# Patient Record
Sex: Female | Born: 1985 | Race: White | Hispanic: No | Marital: Single | Smoking: Current some day smoker
Health system: Southern US, Community
[De-identification: ages and names within clinical notes are randomized; demographics above are authoritative.]

## PROBLEM LIST (undated history)

## (undated) DIAGNOSIS — F329 Major depressive disorder, single episode, unspecified: Secondary | ICD-10-CM

## (undated) DIAGNOSIS — IMO0002 Reserved for concepts with insufficient information to code with codable children: Secondary | ICD-10-CM

## (undated) DIAGNOSIS — A630 Anogenital (venereal) warts: Secondary | ICD-10-CM

## (undated) DIAGNOSIS — E119 Type 2 diabetes mellitus without complications: Secondary | ICD-10-CM

## (undated) DIAGNOSIS — F32A Depression, unspecified: Secondary | ICD-10-CM

## (undated) DIAGNOSIS — J45909 Unspecified asthma, uncomplicated: Secondary | ICD-10-CM

## (undated) DIAGNOSIS — R569 Unspecified convulsions: Secondary | ICD-10-CM

## (undated) HISTORY — DX: Unspecified asthma, uncomplicated: J45.909

## (undated) HISTORY — DX: Type 2 diabetes mellitus without complications: E11.9

## (undated) HISTORY — DX: Anogenital (venereal) warts: A63.0

## (undated) HISTORY — DX: Major depressive disorder, single episode, unspecified: F32.9

## (undated) HISTORY — DX: Reserved for concepts with insufficient information to code with codable children: IMO0002

## (undated) HISTORY — PX: OTHER SURGICAL HISTORY: SHX169

## (undated) HISTORY — PX: UPPER GI ENDOSCOPY: SHX6162

## (undated) HISTORY — DX: Depression, unspecified: F32.A

## (undated) HISTORY — DX: Unspecified convulsions: R56.9

---
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
## 2005-11-07 ENCOUNTER — Other Ambulatory Visit: Admission: RE | Admit: 2005-11-07 | Discharge: 2005-11-07 | Payer: Self-pay | Admitting: Obstetrics & Gynecology

## 2005-12-21 ENCOUNTER — Ambulatory Visit: Payer: Self-pay | Admitting: Internal Medicine

## 2006-02-22 ENCOUNTER — Encounter (INDEPENDENT_AMBULATORY_CARE_PROVIDER_SITE_OTHER): Payer: Self-pay | Admitting: Specialist

## 2006-02-22 ENCOUNTER — Ambulatory Visit (HOSPITAL_COMMUNITY): Admission: RE | Admit: 2006-02-22 | Discharge: 2006-02-23 | Payer: Self-pay | Admitting: Otolaryngology

## 2006-06-04 ENCOUNTER — Other Ambulatory Visit: Admission: RE | Admit: 2006-06-04 | Discharge: 2006-06-04 | Payer: Self-pay | Admitting: Obstetrics & Gynecology

## 2006-08-07 HISTORY — PX: TONSILLECTOMY AND ADENOIDECTOMY: SUR1326

## 2006-08-31 ENCOUNTER — Ambulatory Visit: Payer: Self-pay | Admitting: Internal Medicine

## 2006-08-31 LAB — CONVERTED CEMR LAB
Basophils Absolute: 0.1 10*3/uL (ref 0.0–0.1)
Basophils Relative: 1.1 % — ABNORMAL HIGH (ref 0.0–1.0)
Eosinophils Absolute: 0.1 10*3/uL (ref 0.0–0.6)
Eosinophils Relative: 1.8 % (ref 0.0–5.0)
HCT: 40.7 % (ref 36.0–46.0)
Hemoglobin: 14.2 g/dL (ref 12.0–15.0)
Lymphocytes Relative: 23.1 % (ref 12.0–46.0)
MCHC: 34.8 g/dL (ref 30.0–36.0)
MCV: 90.5 fL (ref 78.0–100.0)
Monocytes Absolute: 0.6 10*3/uL (ref 0.2–0.7)
Monocytes Relative: 8.7 % (ref 3.0–11.0)
Neutro Abs: 4.9 10*3/uL (ref 1.4–7.7)
Neutrophils Relative %: 65.3 % (ref 43.0–77.0)
Platelets: 273 10*3/uL (ref 150–400)
RBC: 4.49 M/uL (ref 3.87–5.11)
RDW: 11.6 % (ref 11.5–14.6)
WBC: 7.4 10*3/uL (ref 4.5–10.5)

## 2006-09-14 ENCOUNTER — Ambulatory Visit: Payer: Self-pay | Admitting: Internal Medicine

## 2006-10-22 ENCOUNTER — Ambulatory Visit: Payer: Self-pay | Admitting: Internal Medicine

## 2006-11-16 ENCOUNTER — Other Ambulatory Visit: Admission: RE | Admit: 2006-11-16 | Discharge: 2006-11-16 | Payer: Self-pay | Admitting: Obstetrics & Gynecology

## 2008-01-02 ENCOUNTER — Other Ambulatory Visit: Admission: RE | Admit: 2008-01-02 | Discharge: 2008-01-02 | Payer: Self-pay | Admitting: Obstetrics and Gynecology

## 2009-06-28 ENCOUNTER — Encounter (INDEPENDENT_AMBULATORY_CARE_PROVIDER_SITE_OTHER): Payer: Self-pay | Admitting: *Deleted

## 2009-06-28 ENCOUNTER — Telehealth (INDEPENDENT_AMBULATORY_CARE_PROVIDER_SITE_OTHER): Payer: Self-pay | Admitting: *Deleted

## 2009-07-05 ENCOUNTER — Ambulatory Visit: Payer: Self-pay | Admitting: Internal Medicine

## 2009-07-05 DIAGNOSIS — R1084 Generalized abdominal pain: Secondary | ICD-10-CM | POA: Insufficient documentation

## 2009-07-05 DIAGNOSIS — R1013 Epigastric pain: Secondary | ICD-10-CM

## 2009-07-05 LAB — CONVERTED CEMR LAB: Tissue Transglutaminase Ab, IgA: 0.2 units (ref <7)

## 2009-07-06 LAB — CONVERTED CEMR LAB
ALT: 13 units/L (ref 0–35)
AST: 13 units/L (ref 0–37)
Albumin: 4 g/dL (ref 3.5–5.2)
Alkaline Phosphatase: 80 units/L (ref 39–117)
Amylase: 56 units/L (ref 27–131)
BUN: 6 mg/dL (ref 6–23)
Basophils Absolute: 0.1 10*3/uL (ref 0.0–0.1)
Basophils Relative: 0.9 % (ref 0.0–3.0)
Bilirubin Urine: NEGATIVE
Bilirubin, Direct: 0 mg/dL (ref 0.0–0.3)
CO2: 28 meq/L (ref 19–32)
Calcium: 8.8 mg/dL (ref 8.4–10.5)
Chloride: 102 meq/L (ref 96–112)
Creatinine, Ser: 0.6 mg/dL (ref 0.4–1.2)
Eosinophils Absolute: 0.2 10*3/uL (ref 0.0–0.7)
Eosinophils Relative: 2.6 % (ref 0.0–5.0)
GFR calc non Af Amer: 130.69 mL/min (ref >60)
Glucose, Bld: 126 mg/dL — ABNORMAL HIGH (ref 70–99)
H Pylori IgG: NEGATIVE
HCT: 41.7 % (ref 36.0–46.0)
Hemoglobin, Urine: NEGATIVE
Hemoglobin: 14.3 g/dL (ref 12.0–15.0)
IgA: 149 mg/dL (ref 68–378)
Ketones, ur: NEGATIVE mg/dL
Leukocytes, UA: NEGATIVE
Lipase: 3 units/L — ABNORMAL LOW (ref 11.0–59.0)
Lymphocytes Relative: 27.3 % (ref 12.0–46.0)
Lymphs Abs: 1.7 10*3/uL (ref 0.7–4.0)
MCHC: 34.2 g/dL (ref 30.0–36.0)
MCV: 92.7 fL (ref 78.0–100.0)
Monocytes Absolute: 0.5 10*3/uL (ref 0.1–1.0)
Monocytes Relative: 7.6 % (ref 3.0–12.0)
Neutro Abs: 3.8 10*3/uL (ref 1.4–7.7)
Neutrophils Relative %: 61.6 % (ref 43.0–77.0)
Nitrite: NEGATIVE
Platelets: 242 10*3/uL (ref 150.0–400.0)
Potassium: 4 meq/L (ref 3.5–5.1)
RBC: 4.5 M/uL (ref 3.87–5.11)
RDW: 11.9 % (ref 11.5–14.6)
Sodium: 138 meq/L (ref 135–145)
Specific Gravity, Urine: 1.025 (ref 1.000–1.030)
Total Bilirubin: 0.8 mg/dL (ref 0.3–1.2)
Total Protein, Urine: NEGATIVE mg/dL
Total Protein: 7 g/dL (ref 6.0–8.3)
Urine Glucose: NEGATIVE mg/dL
Urobilinogen, UA: 0.2 (ref 0.0–1.0)
WBC: 6.3 10*3/uL (ref 4.5–10.5)
pH: 5.5 (ref 5.0–8.0)

## 2009-07-15 ENCOUNTER — Ambulatory Visit (HOSPITAL_COMMUNITY): Admission: RE | Admit: 2009-07-15 | Discharge: 2009-07-15 | Payer: Self-pay | Admitting: Internal Medicine

## 2009-07-15 ENCOUNTER — Telehealth: Payer: Self-pay | Admitting: Internal Medicine

## 2009-07-26 ENCOUNTER — Telehealth: Payer: Self-pay | Admitting: Internal Medicine

## 2010-12-23 NOTE — Assessment & Plan Note (Signed)
[facility]                         GASTROENTEROLOGY OFFICE NOTE   Gina Armstrong, Gina Armstrong                   MRN:          [id number]  DATE:09/14/2006                            DOB:          [birth year]    REFERRING PHYSICIAN:  Titus Dubin. Alwyn Ren, MD,FACP,FCCP   REASON FOR CONSULTATION:  Rectal bleeding.   HISTORY:  This is a 25 year old white female, type 1 diabetic, who is  referred through the courtesy of Dr. Alwyn Ren regarding recurrent rectal  bleeding.  The patient was in her usual state of good health until  August 29, 2006, when after having a normal bowel movement, noticed a  large volume of blood in the toilet bowl, as well as the tissue.  There  was no associated abdominal pain, no associated rectal pain.  The  patient was seen two days later by Dr. Alwyn Ren.  No significant  abnormality on rectal exam.  Hemoccult testing that day was negative.  CBC was normal.  She was presumed to have a transient fissure and  expectant management was recommended.  However, she developed a second  episode of bleeding, which was not nearly as voluminous.  Overall, she  has been well.  Diabetes has been under good control.  She has had no  problems with abdominal pain, change in bowel habits, constipation,  appetite or weight change.  She is active in school.   PAST MEDICAL HISTORY:  Type 1 diabetes since age 87.   PAST SURGICAL HISTORY:  None.   ALLERGIES:  Possibly AUGMENTIN.   REGULAR MEDICATIONS:  1. Humalog insulin pump.  2. Depo-Provera injections every 3 months.  3. Ibuprofen, Aleve and Tylenol p.r.n.   FAMILY HISTORY:  Father with colon polyps.   SOCIAL HISTORY:  The patient is single, without children.  She is a  Holiday representative at [employer].  She occasionally smokes and  occasionally uses alcohol.   REVIEW OF SYSTEMS:  Per diagnostic evaluation form.   PHYSICAL EXAM:  Well-appearing female, in no acute distress.  Blood  pressure is  138/74, heart rate is 82, weight is 141 pounds.  She is 5  feet 10 inches in height.  HEENT:  Sclerae are anicteric.  Oral mucosa is intact.  There is no  adenopathy.  LUNGS:  Clear.  HEART:  Regular.  ABDOMEN:  Soft, without tenderness, mass or hernia.  EXTREMITIES:  Without edema.  RECTAL EXAM: Per Dr. Alwyn Ren Jan.25,2008. Not repeated today.   IMPRESSION:  This is a 25 year old with recurrent rectal bleeding.  No  obvious cause on rectal exam per Dr. Alwyn Ren.  The patient could have a  fissure, though I would expect her to have some tenderness or  discomfort.  She certainly could have internal hemorrhoids.  Alternatively, she could have a juvenile retention polyp or less likely  adenomatous neoplasia.   RECOMMENDATIONS:  At this point, due to the recurrent nature of the  bleeding and lack of obvious cause, I think it would be best to proceed  with direct evaluation.  We will set her up for colonoscopy and, if  necessary, polypectomy.  I have discussed the  nature of the procedure,  as well as the risks, benefits, and alternatives, with the patient and  her mom.  We also discussed management of her insulin.  She will contact  her endocrinologist.  I advised that, if there are any interval  questions or problems, to give me a call.     Wilhemina Bonito. Marina Goodell, MD  Electronically Signed    JNP/MedQ  DD: 09/14/2006  DT: 09/14/2006  Job #: [id number]   cc:   Titus Dubin. Alwyn Ren, MD,FACP,FCCP

## 2010-12-23 NOTE — H&P (Signed)
Gina Armstrong, Gina Armstrong            ACCOUNT NO.:  [id number]   MEDICAL RECORD NO.:  [id number]          PATIENT TYPE:  INP   LOCATION:  2899                         FACILITY:  [facility]   PHYSICIAN:  Carolan Shiver, M.D.    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  When seen on November 28, 2005 she had had  a five to six-week history of chronic illness.  She had been treated by Dr.  Samuella Cota, otolaryngologist in [place].  She had been treated with three  different antibiotics and they were causing GI upset.  She was currently on  Omnicef when seen.  She had had a white blood cell count of 19,000, was  treated with IM antibiotics.  Her white count elevated again to 13,000.  She  had had a history of Streptococcal tonsillitis dating back to age 67.  She,  importantly, has been diagnosed as having type 1 diabetes mellitus and was  currently on an insulin pump, not in good control.  She has also had an  abnormal Pap smear with some dysplastic changes and was at that time waiting  to hear her final pathology report.   Dr. Samuella Cota had recommended a tonsillectomy.  She desired to have this closer  to home so she could be with her parents for the convalescent period.  She  then was traveling to [place] for summer study and delayed the tonsillectomy  until her return from [place].   PAST MEDICAL HISTORY:  Type 1 diabetes mellitus with insulin pump.   OPERATIONS:  None reported.   MEDICATIONS:  1.  Humalog insulin via her insulin pump.  She uses Humalog insulin per day      in the high 30s to  low 40 range.  2.  She is also on Depo Provera subcutaneously once every three months.  3.  She was on Omnicef 300 mg b.i.d. at that time, was decreased to 300 mg      p.o. daily while she was in [place] as prophylaxis.   ALLERGIES TO MEDICATIONS:  None reported.   FAMILY HISTORY:  Positive for heart disease.   SOCIAL HISTORY:  She is a Medical laboratory scientific officer at [employer].  She  does drink alcohol socially and uses tobacco products socially.   REVIEW OF SYSTEMS:  Positive for type 1 diabetes and seasonal allergies.   PHYSICAL EXAMINATION:  GENERAL:  The patient was a well-developed, well-  nourished, thin, tall white female in no acute distress.  She had no  recognizable syndromes or patterns of malformation.  HEENT:  Facial function was intact.  She had no nystagmus.  Pupils are  PERRLA.  Extraocular ears and canals were stable.  TMs were clear and  mobile  bilaterally.  Nose was negative both externally and internally.  Oral  cavity, lips, tongue, palate were normal.  She had 3.5+ erythematous,  edematous, endophytic tonsils with erythema of the anterior and posterior  tonsillar pillars and posterior margin of her soft palate.  NECK:  1.5-2 cm nontender jugulo-carotid nodes bilaterally, left greater  than right.  CHEST:  Clear to auscultation.  HEART:  Normal sinus rhythm without murmurs, rubs, or gallops.  BREASTS:  Not done.  ABDOMEN:  Negative.  GENITALIA:  Not performed.  RECTAL:  Not performed.  EXTREMITIES:  Normal.  She did not seem to have any diabetic neuropathy.  Gait was also normal.  NEUROLOGIC:  Physiologic.  Mental status examination:  She was awake, alert,  coherent, spontaneous, and logical.   ADMISSION LABORATORY DATA:  White blood cell count 7700, hemoglobin of 14.2,  hematocrit of 42, white blood cell count 7700, platelet count 264,000.  PT  was 13.1, PTT was 30, INR was 1.  Urine showed elevated glucose of 250.  Otherwise, her urine was unremarkable.   EKG showed sinus bradycardia and  otherwise was within normal limits.   IMPRESSION:  1.  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  2.  Type 2 insulin-dependent diabetes mellitus using an insulin pump.   RECOMMEND:  1.  Patient was recommended for tonsillectomy and adenoidectomy for 24-hour      overnight stay to manage her diabetes mellitus based on her anticipated      decrease in oral intake.  Risks and complications of T&A were explained      to Abbygael and her mother.  Questions were invited and answered and      informed consent was signed and witnessed.  2.  Procedure is scheduled for February 22, 2006 7:30 a.m. [facility] main OR room #3.           ______________________________  Carolan Shiver, M.D.     EMK/MEDQ  D:  02/22/2006  T:  02/22/2006  Job:  [id number]

## 2010-12-23 NOTE — Op Note (Signed)
Gina Armstrong NO.:  [id number]   MEDICAL RECORD NO.:  [id number]          PATIENT TYPE:  INP   LOCATION:  6123                         FACILITY:  [facility]   PHYSICIAN:  Carolan Shiver, M.D.    DATE OF BIRTH:  23-Jul-1986   DATE OF PROCEDURE:  02/22/2006  DATE OF DISCHARGE:                                 OPERATIVE REPORT   JUSTIFICATION FOR PROCEDURE:  The patient is a 25 year old white female,  [employer] coed, here today for tonsillectomy/adenoidectomy to  treat chronic recurrent streptococcal tonsillitis, with Group C Strep  carrier state.  The patient also is a type 1 diabetic, on an insulin pump.  The patient had developed beta-hemolytic Strep, Group C carrier state, and  had been treated by Dr. Samuella Cota in [place], [place].   When seen by me on November 28, 2005, she had had a 5-6 week illness.  She had  been very sick with each infection.  She originally presented to an urgent  care center, with a white blood cell count of 19,000, and underwent IM  medication injections.  Her white count elevated again to 13,000.   She began having streptococcal tonsillitis as a child, beginning at age 31.  She is currently a type 1 diabetic, on an insulin pump.  She has also had an  abnormal pap smear in the past, with some dysplastic changes.  She was  recommended for a tonsillectomy by Dr. Samuella Cota.  She wanted to have this  performed closer to home, where her parents live, during the summer, prior  to returning to [employer] to continue her studies.   She typically used 30-40 units of Humalog per day, in her insulin pump.  Recently, she has been on a 6-week course of Omnicef 300 mg b.i.d.   On physical examination on November 28, 2005, the patient was found to have 3-  1/2 plus, erythematous, endophytic tonsils, with erythema of the posterior  pillars and posterior margin of her soft palate.  She had 1.5-2 cm jugulo-  carotid nodes bilaterally.  She  was diagnosed as having recurrent  streptococcal tonsillitis, with a Group C Strep carrier state and type 1  diabetes mellitus, on an insulin pump.   She was recommended for T&A in the OR, with a 24-hour stay to manage her  diabetes.  Risks and complications of T&A were explained to the patient and  her mother, questions were invited and were answered, and informed consent  was signed and witnessed.   JUSTIFICATION FOR OUTPATIENT SETTING:  This patient's age, need for general  endotracheal anesthesia.   JUSTIFICATION FOR OVERNIGHT STAY:  1.  Twenty-three hours of observation to rule out postoperative      tonsillectomy hemorrhage.  2.  Type 1 diabetic, on an insulin pump.  3.  IV pain control and hydration.   PREOPERATIVE DIAGNOSES:  1.  Recurrent streptococcal tonsillitis, with Group C streptococcal carrier      state.  2.  Type 1 diabetes mellitus, using an insulin pump.   POSTOPERATIVE DIAGNOSES:  1.  Recurrent streptococcal tonsillitis, with Group  C streptococcal carrier      state.  2.  Type 1 diabetes mellitus, using an insulin pump.   OPERATIONS:  Tonsillectomy and adenoidectomy.   SURGEON:  Carolan Shiver, MD.   ANESTHESIA:  General endotracheal, Dr. Bedelia Person.   COMPLICATIONS:  None.   SUMMARY REPORT:  After the patient was taken to the operating room, she was  placed in the supine position and a time-out was performed.  Pre-op CBG was  267.  She was given 3-1/2 units of insulin in the PACU.  General IV  induction was then performed, under the guidance of Dr. Bedelia Person.  The  patient was orally intubated, without difficulty.  Eyelids were taped shut  and she was properly positioned and monitored.  Elbows and ankles were  padded with foam rubber.   A review of her pre-op laboratory tests were within normal limits, except  for her urine glucose level.   Patient was then turned 90 degrees and placed in a Rose position.  A head  drape was applied and a Crowe-Davis  mouth gag was inserted, followed by a  moistened throat pack.  Examination of her oropharynx revealed 3-1/2, almost  4 plus, endophytic embedded tonsils.   The right tonsil was secured with curved Allis clamp and an anterior pillar  incision was made with cutting cautery.  The tonsillar capsule was  identified and the tonsil was dissected from the tonsillar fossa with  cutting and coagulating currents.  Vessels were cauterized in order.  The  left tonsil was removed in identical fashion.   Each fossa was then infiltrated with 3 mL of 0.5% Marcaine with 1:200,000  epinephrine.  Each fossa was then irrigated with saline and small veins were  pinpoint cauterized with suction cautery, using a Kitner to spread the  tissue.   Red rubber catheter was placed through the right naris and used as a soft  palate retractor.  Examination of her nasopharynx with a mirror revealed  small amount of residual adenoid tissue.  The adenoids were then removed  with curved, downgoing curets, and bleeding was controlled with packing and  suction cautery.  Throat pack was removed and a #12-gauge saline-soaked NG  tube was inserted into the stomach and gastric contents were evacuated.   Patient was then awakened, extubated, and transferred to her hospital bed.  She appeared to tolerate both the general endotracheal anesthesia and the  procedures well, and left the operating room in stable condition.   Total fluids:  750 mL.  Estimated blood loss:  Less than 10 mL.  Sponge,  needle, and cotton ball counts were correct at termination of the procedure.  Tonsils, right and left, and adenoid specimens were sent to pathology for  permanent pathologic evaluation and documentation.  Patient received 5 mg of  Decadron, 4 mg of Zofran at the beginning and end of the procedure, and 1 g  of IV Ancef.  The patient will be admitted to PACU and then to [facility].  She  will be maintained on her home insulin pump,  with supplementation of sliding-  scale insulin as needed, based on her oral intake.  If stable overnight, she  will be discharged on February 23, 2006, and will be instructed to return to my  office on March 08, 2006, at 1:20 p.m.   Discharge medications will include the following:  1.  Amoxicillin suspension 500 mg p.o. t.i.d. x 10 days.  2.  Lortab elixir 250 mL  with 1 refill, 1 tablespoon PO q.6 h. p.r.n. pain.  3.  Phenergan suppositories 25 mg #2 one PO q.6 h. p.r.n. nausea.   She is to keep her head elevated, avoid aspirin/aspirin products, maintain  her serum glucose between 100 and 200 using her insulin pump, and call [phone] for any postoperative problems.  She will be on a soft diet x 1 week.  Again, she is to call [phone] for any postoperative problems or questions  related to the procedure.           ______________________________  Carolan Shiver, M.D.     EMK/MEDQ  D:  02/22/2006  T:  02/23/2006  Job:  [id number]

## 2010-12-23 NOTE — Discharge Summary (Signed)
NAMESIMRIT, GOHLKE            ACCOUNT NO.:  [id number]   MEDICAL RECORD NO.:  [id number]          PATIENT TYPE:  INP   LOCATION:  6123                         FACILITY:  [facility]   PHYSICIAN:  Carolan Shiver, M.D.    DATE OF BIRTH:  April 15, 1986   DATE OF ADMISSION:  02/22/2006  DATE OF DISCHARGE:  02/23/2006                                 DISCHARGE SUMMARY   ADMISSION DIAGNOSES:  1.  Recurrent streptococcal tonsillitis with group C streptococcal carrier      state.  2.  Type 1 diabetes mellitus on an insulin pump.   DISCHARGE DIAGNOSES:  The same.   OPERATIONS:  Tonsillectomy and adenoidectomy, 02/22/2006.   ANESTHESIA:  General endotracheal, Dr. Bedelia Person.   COMPLICATIONS:  None.   DISCHARGE STATUS:  Stable.   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  The patient has had a several year history of chronic disease,  necessitating multiple antibiotics.  This has been complicating her type 1  diabetes mellitus, for which she is using an insulin pump.   On 02/22/2006, she was taken to the [facility] operating room #3, and  underwent uncomplicated tonsillectomy and adenoidectomy.  She was found to  have 3-1/2+ endocytic erythematous tonsils near her oropharynx, and a small  amount of adenoid tissue.  She had an uncomplicated course in the PACU.  Her  admission glucose was 267.  Her glucose was managed through the  hospitalization using her insulin pump.  She was admitted to [facility], where she, by 6 p.m., was awake, alert, had stable vital signs  and a stable airway.  Saturation were 99 on room air.  She had no bleeding.  She had received 2.8 units of insulin at 12 noon, 8.1 units at 4 p.m., and  her glucose at 6 p.m.  was approximately 167.  She had no oral pharyngeal  bleeding, and was stable when drinking.  That evening by 7 o'clock, her CBG  was 291.  She was managing her glucose with her glucose pump with bolus  doses.  It was discovered that her glucose meter was off by 100 units, and  today we will either recallibrate her meter or gain a new meter.   By the 1st postoperative day, 02/23/2006, she was awake, alert and afebrile.  She was taking liquids.  Her last CBG was 278, and she was managing this  with her insulin pump.  Oropharynx showed tonsillar fossae reclusion.  She  had no bleeding.  Her uvula edema had decreased to 2+, and she was  recommended for discharge with her parents today.  Prior to discharge, the  nurses will work with her glucose meter with a diabetic nurse consultant,  and make sure that she has a functioning meter reading properly prior to  discharge from the hospital.   DISCHARGE MEDICATIONS:  Home insulin pump, amoxicillin suspension 500 mg  p.o. t.i.d. x10 days with food, Lortab elixir 1 tbsp p.o. q.6 hours p.r.n.  pain, Phenergan suppositories 25 mg p.o. q.6 hours p.r.n. nausea.   DISCHARGE INSTRUCTIONS:  She is to follow a soft diet x1 week.  Keep her  head elevated.  Avoid aspirin and aspirin production, and follow quiet  indoor activity.  Discharge followup in the office is 03/26/2006 at 1:20  p.m.  She is to call [phone] for any postoperative problems related to her  procedure.  She will be given both verbal and written instructions.   LABORATORY DATA:  At the time of discharge summary dictation, permanent  pathologic evaluation of tonsils and adenoids had not been completed.  Admission hemoglobin 14.2, hematocrit 42, white blood cell count 7,700,  platelet count 264,000.  PT was 13.1, INR 1.0, PTT 30.  She had unremarkable  urinalysis with a the exception of a glucose of 250.  Admission EKG showed  sinus bradycardia, but otherwise within normal limits.  Chest  x-ray showed  no active disease.   At the time of hospitalization, she was in [facility] OR room #3, the PACU,  and 6100 room 6123.           ______________________________  Carolan Shiver, M.D.     EMK/MEDQ  D:  02/23/2006  T:  02/23/2006  Job:  [id number]

## 2010-12-23 NOTE — Assessment & Plan Note (Signed)
[facility]                        [facility] OFFICE NOTE   NAME:[name], [name]                   MRN:          [id number]  DATE:08/31/2006                            DOB:          10/07/1985    Gina Armstrong was seen August 31, 2006 complaining of rectal  bleeding x1.  It occurred January 23 at approximately 7:20 pm.  She  noted blood in the toilet and on the tissue, but had no abdominal pain  or pain with the passage of the stool.  She has had no constitutional  symptoms.  She occasionally has heartburn and occasionally will have  loose stool.  She denies melena.   Significantly, she has insulin-dependent diabetes, juvenile onset. She  is on Depo-Provera for amenorrhea.   A maternal grandmother may have had stomach ulcer.   Gina Armstrong weight is up 10 pounds to 142.  Vital signs were otherwise  negative.  She had no organomegaly or lymphadenopathy.   Hemoccult testing was negative.  Stool cards were provided.  CBC and  differential were normal.   She has no history to suggest any bleeding dyscrasias.  GI consultation  will be requested.  The most likely etiology of bleeding in view of the  negative / normal findings would be a rectal fissure which is sealed  itself.     Titus Dubin. Alwyn Ren, MD,FACP,FCCP  Electronically Signed    [facility]/MedQ  DD: 09/01/2006  DT: 09/01/2006  Job #: [id number]

## 2011-01-04 IMAGING — US US ABDOMEN COMPLETE
1 series · 14 of 25 positions shown · non-contrast
Comparison: None.

CLINICAL DATA: Epigastric pain

COMPLETE ABDOMINAL ULTRASOUND

[Series 1: us abdomen complete · 0.30mm/px · 14 of 71 slices shown]
[im 1/71]
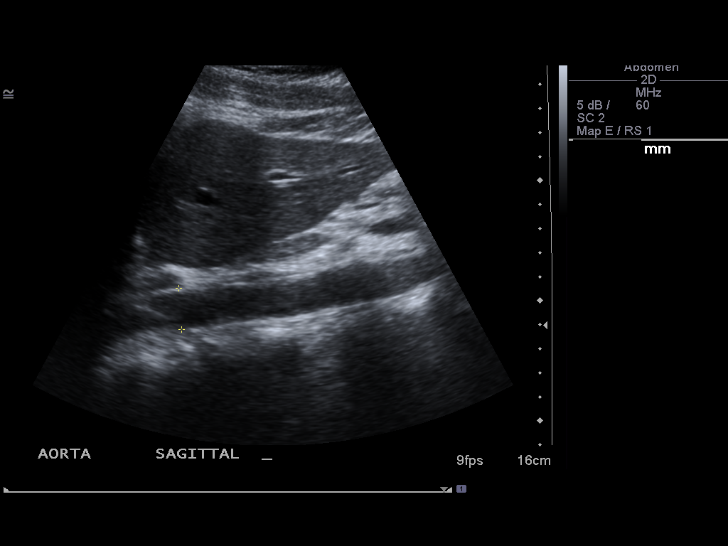
[im 6/71]
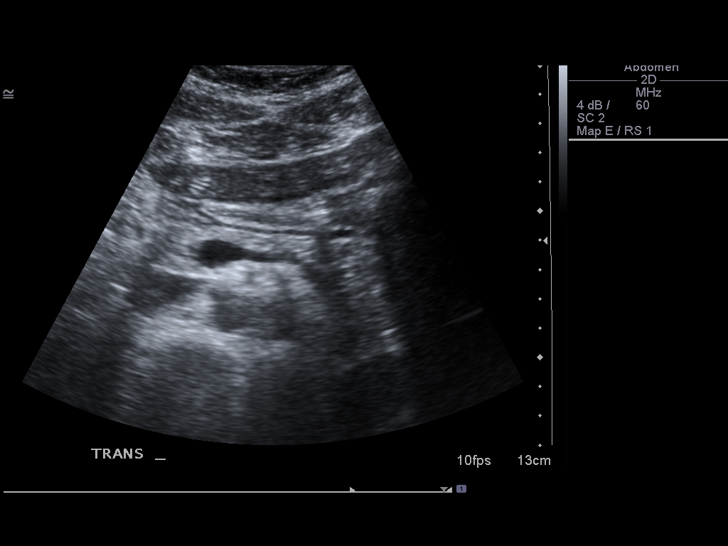
[im 12/71]
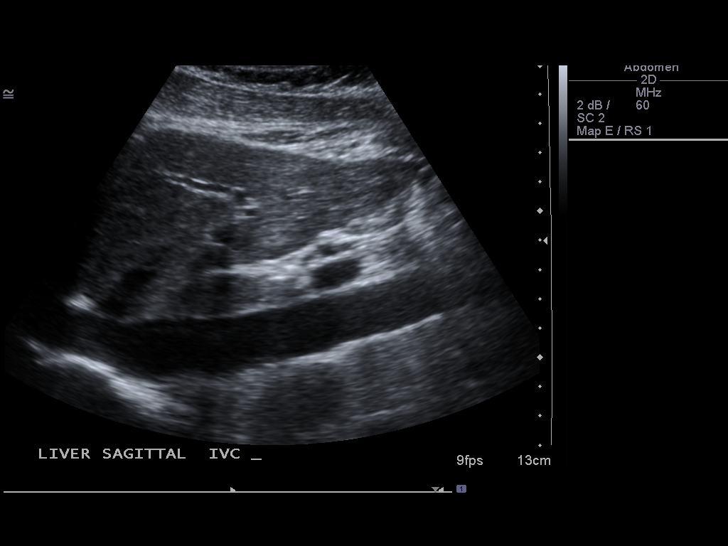
[im 18/71]
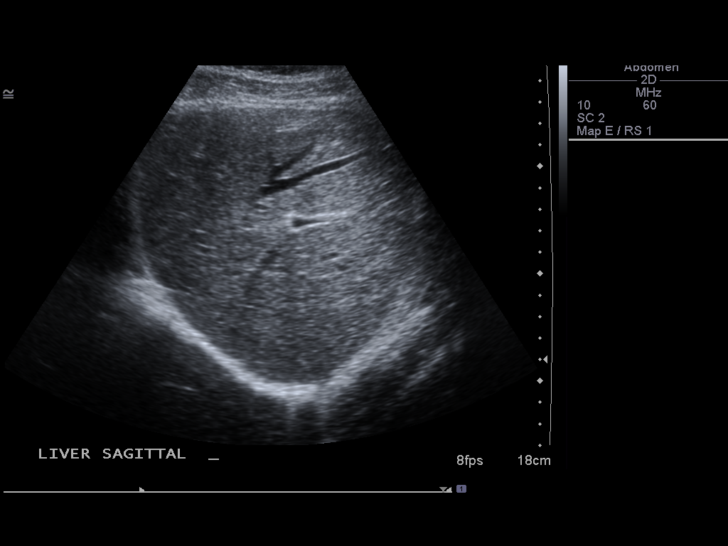
[im 24/71]
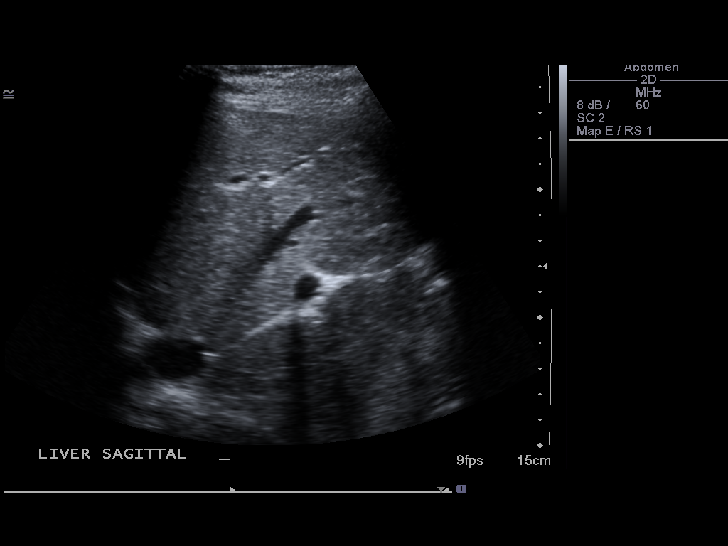
[im 27/71]
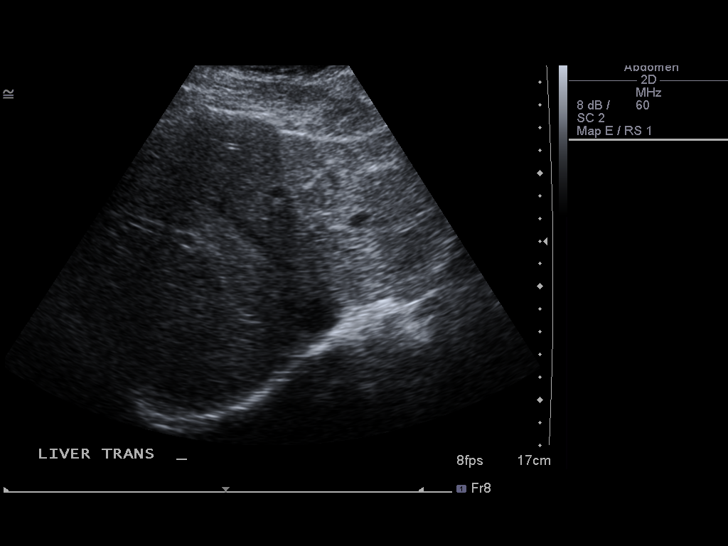
[im 33/71]
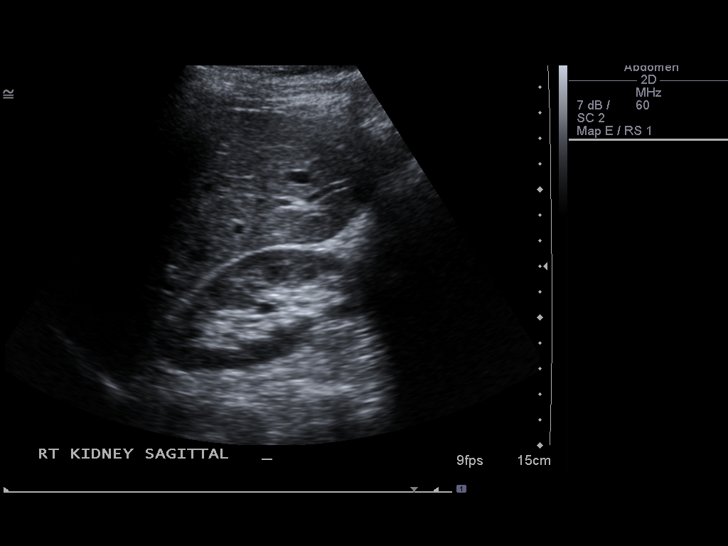
[im 38/71]
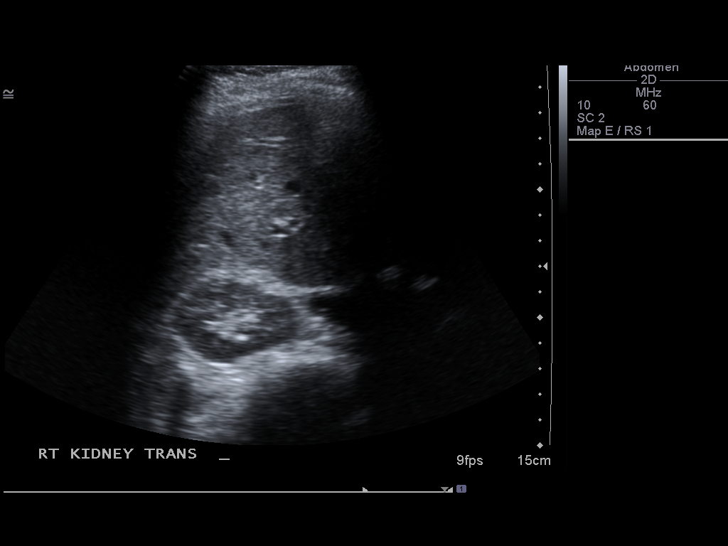
[im 44/71]
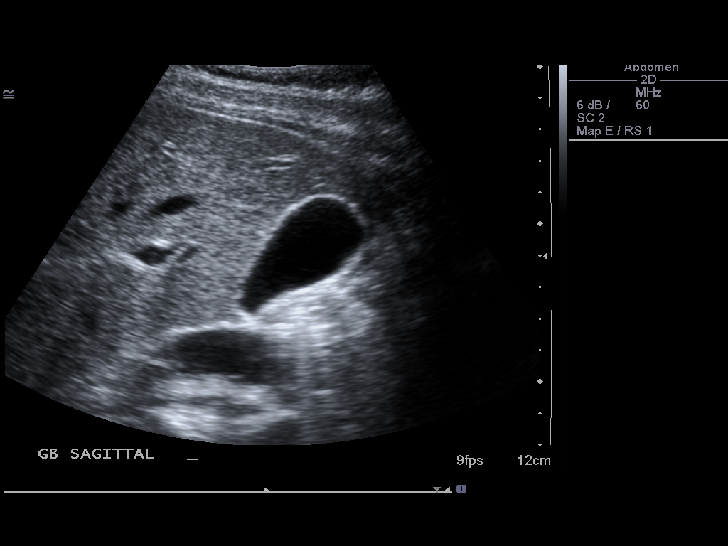
[im 47/71]
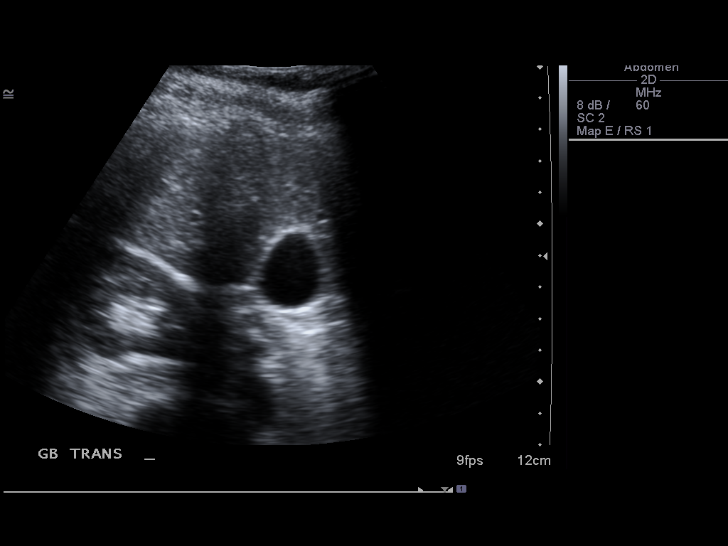
[im 53/71]
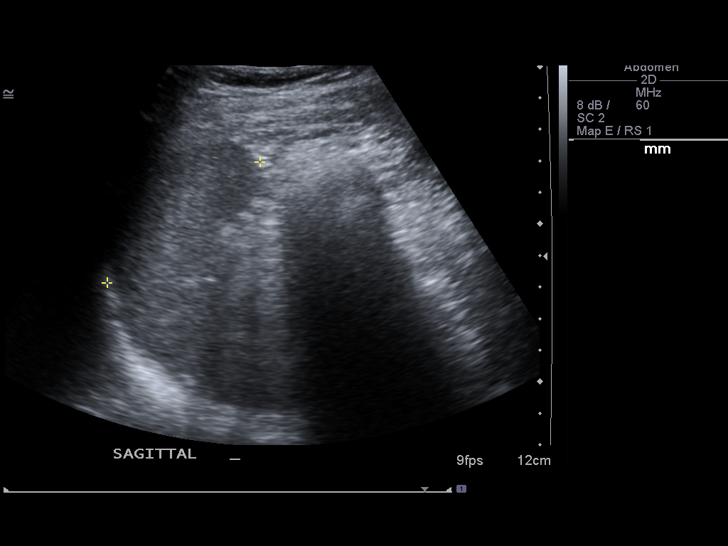
[im 59/71]
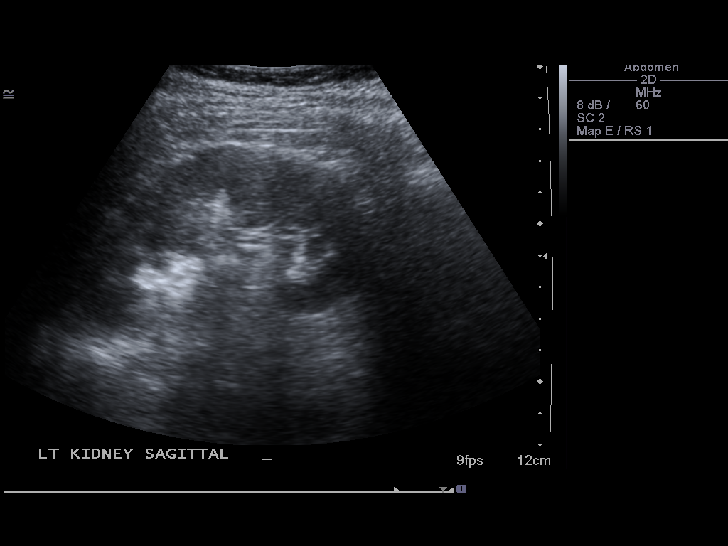
[im 65/71]
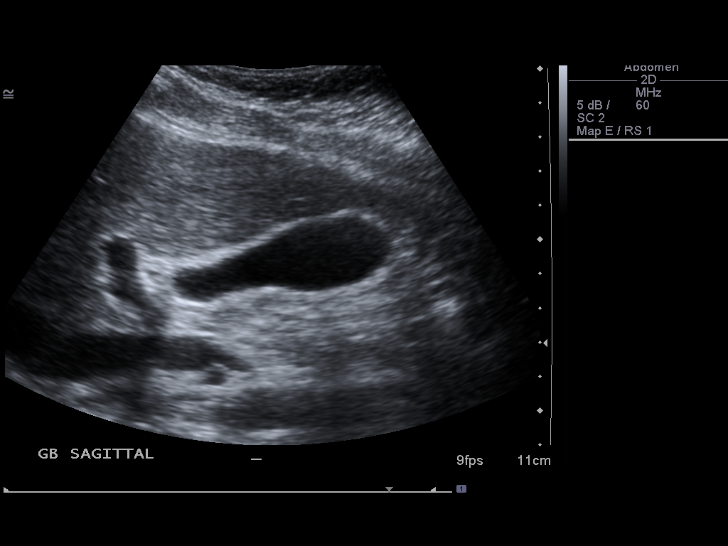
[im 71/71]
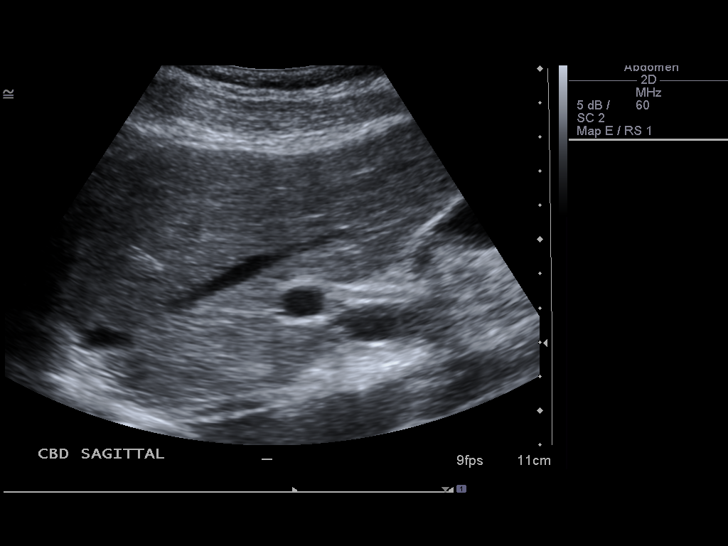

[14 of 25 positions shown; findings below may reference images not displayed]

FINDINGS: Gallbladder:  The gallbladder is well visualized and no gallstones
are seen.  No pain is present over the gallbladder upon compression
through

Common bile duct:  The common bile duct is normal measuring 2.3 mm
in diameter.

Liver:  The liver has a normal echogenic pattern.  No ductal
dilatation is seen.

IVC:  Appears normal.

Pancreas:  No focal abnormality seen.

Spleen:  The spleen is normal measuring 6.1 cm sagittally.

Right Kidney:  No hydronephrosis is seen.  The right kidney
measures 9.9 cm sagittally.

Left Kidney:  No hydronephrosis and the left kidney measures
cm.

Abdominal aorta:  The abdominal aorta is normal in caliber.
IMPRESSION: Negative abdominal ultrasound.

## 2013-07-23 ENCOUNTER — Ambulatory Visit: Payer: Self-pay | Admitting: Internal Medicine

## 2014-02-20 ENCOUNTER — Ambulatory Visit (INDEPENDENT_AMBULATORY_CARE_PROVIDER_SITE_OTHER): Payer: 59 | Admitting: Internal Medicine

## 2014-02-20 ENCOUNTER — Encounter: Payer: Self-pay | Admitting: Internal Medicine

## 2014-02-20 VITALS — BP 130/80 | HR 77 | Temp 98.1°F | Ht 70.0 in | Wt 174.2 lb

## 2014-02-20 DIAGNOSIS — E109 Type 1 diabetes mellitus without complications: Secondary | ICD-10-CM | POA: Insufficient documentation

## 2014-02-20 DIAGNOSIS — Z0000 Encounter for general adult medical examination without abnormal findings: Secondary | ICD-10-CM

## 2014-02-20 DIAGNOSIS — K589 Irritable bowel syndrome without diarrhea: Secondary | ICD-10-CM | POA: Insufficient documentation

## 2014-02-20 DIAGNOSIS — R079 Chest pain, unspecified: Secondary | ICD-10-CM

## 2014-02-20 NOTE — Patient Instructions (Signed)
Your next office appointment will be determined based upon review of your pending labs . Those instructions will be transmitted to you through My Chart   Please take a probiotic , Florastor OR Align, every day until the bowels are normal. This will replace the normal bacteria which  are necessary for formation of normal stool and processing of food.  

## 2014-02-20 NOTE — Progress Notes (Signed)
Subjective:    Patient ID: Gina Armstrong, female    DOB: [birth year], 28 y.o.   MRN: [id number]  HPI  She is here for a physical;acute issues include IBS. [place]  & will be establishing medical care there.     Review of Systems Diabetes status assessment: Fasting  Glucose on basal insulin < 120. CGM monitored 3-5X/ day in low 100s. Glucose 2 hours after any meal is not monitored. Occasional hypoglycemia reported                                                                                                                 Regular exercise described as cycling 3 times per week w/o chest pain or dyspnea. Carb counting nutrition program followed Medication compliance is good. No medication adverse effects noted. Eye exam current. 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  A1c 8.2% in 08/2013.  No excess thirst ;  excess hunger ; or excess urination reported                              No lightheadedness with standing reported No palpitations or claudication described. Occasional sharp chest pain @ rest lasting several minutes.                                                                                                                            Poorly  healing skin lesions from bug bites of left leg reported. Occasional tingling of fingers described                                                                                                                                             No significant change in weight.Intermittent loose stool in context of PMH of prolonged antibiotic therapy. No blurred,double, or loss of  vision reported  .         Objective:   Physical Exam Gen.: Healthy and well-nourished in appearance. Alert, appropriate and cooperative throughout exam. Appears younger than stated age  Head: Normocephalic without obvious abnormalities  Eyes: No corneal or conjunctival inflammation noted. Pupils equal round reactive to light  and accommodation. Extraocular motion intact.  Ears: External  ear exam reveals no significant lesions or deformities. Canals clear .TMs normal. Hearing is grossly normal bilaterally. Nose: External nasal exam reveals no deformity or inflammation. Nasal mucosa are pink and moist. No lesions or exudates noted.   Mouth: Oral mucosa and oropharynx reveal no lesions or exudates. Teeth in good repair. Neck: No deformities, masses, or tenderness noted. Range of motion & Thyroid normal Lungs: Normal respiratory effort; chest expands symmetrically. Lungs are clear to auscultation without rales, wheezes, or increased work of breathing. Heart: Normal rate and rhythm. Normal S1 and S2. No gallop, click, or rub. No murmur. Abdomen: Bowel sounds normal; abdomen soft and nontender. No masses, organomegaly or hernias noted. Genitalia: as per Gyn                                  Musculoskeletal/extremities: No deformity or scoliosis noted of  the thoracic or lumbar spine.  No clubbing, cyanosis, edema, or significant extremity  deformity noted. Range of motion normal .Tone & strength normal. Hand joints normal.  Fingernail / toenail health good. Able to lie down & sit up w/o help. Negative SLR bilaterally Vascular: Carotid, radial artery, dorsalis pedis and  posterior tibial pulses are full and equal. No bruits present. Neurologic: Alert and oriented x3. Deep tendon reflexes symmetrical and normal.  Gait normal .     Skin: Intact without suspicious lesions or rashes.Two well healed granuloma LLE above ankle. Lymph: No cervical, axillary lymphadenopathy present. Psych: Mood and affect are normal. Normally interactive                                                                                        Assessment & Plan:  #1 comprehensive physical exam; no acute findings  Plan: see Orders  & Recommendations

## 2014-02-20 NOTE — Progress Notes (Signed)
Pre visit review using our clinic review tool, if applicable. No additional management support is needed unless otherwise documented below in the visit note. 

## 2014-02-21 ENCOUNTER — Telehealth: Payer: Self-pay | Admitting: Internal Medicine

## 2014-02-21 NOTE — Telephone Encounter (Signed)
Relevant patient education assigned to patient using Emmi. ° °

## 2014-02-23 ENCOUNTER — Other Ambulatory Visit (INDEPENDENT_AMBULATORY_CARE_PROVIDER_SITE_OTHER): Payer: 59

## 2014-02-23 DIAGNOSIS — Z0000 Encounter for general adult medical examination without abnormal findings: Secondary | ICD-10-CM

## 2014-02-23 LAB — HEPATIC FUNCTION PANEL
ALK PHOS: 75 U/L (ref 39–117)
ALT: 10 U/L (ref 0–35)
AST: 13 U/L (ref 0–37)
Albumin: 3.5 g/dL (ref 3.5–5.2)
Bilirubin, Direct: 0.1 mg/dL (ref 0.0–0.3)
Total Bilirubin: 0.5 mg/dL (ref 0.2–1.2)
Total Protein: 6.3 g/dL (ref 6.0–8.3)

## 2014-02-23 LAB — BASIC METABOLIC PANEL
BUN: 11 mg/dL (ref 6–23)
CO2: 23 mEq/L (ref 19–32)
Calcium: 8.5 mg/dL (ref 8.4–10.5)
Chloride: 107 mEq/L (ref 96–112)
Creatinine, Ser: 0.7 mg/dL (ref 0.4–1.2)
GFR: 102.14 mL/min (ref >60.00)
Glucose, Bld: 269 mg/dL — ABNORMAL HIGH (ref 70–99)
POTASSIUM: 4.2 meq/L (ref 3.5–5.1)
Sodium: 137 mEq/L (ref 135–145)

## 2014-02-23 LAB — CBC WITH DIFFERENTIAL/PLATELET
Basophils Absolute: 0 10*3/uL (ref 0.0–0.1)
Basophils Relative: 0.4 % (ref 0.0–3.0)
EOS PCT: 2.4 % (ref 0.0–5.0)
Eosinophils Absolute: 0.2 10*3/uL (ref 0.0–0.7)
HEMATOCRIT: 41.1 % (ref 36.0–46.0)
Hemoglobin: 13.8 g/dL (ref 12.0–15.0)
Lymphocytes Relative: 33 % (ref 12.0–46.0)
Lymphs Abs: 2.3 10*3/uL (ref 0.7–4.0)
MCHC: 33.7 g/dL (ref 30.0–36.0)
MCV: 90.8 fl (ref 78.0–100.0)
MONOS PCT: 7.6 % (ref 3.0–12.0)
Monocytes Absolute: 0.5 10*3/uL (ref 0.1–1.0)
Neutro Abs: 3.9 10*3/uL (ref 1.4–7.7)
Neutrophils Relative %: 56.6 % (ref 43.0–77.0)
PLATELETS: 247 10*3/uL (ref 150.0–400.0)
RBC: 4.53 Mil/uL (ref 3.87–5.11)
RDW: 12.4 % (ref 11.5–15.5)
WBC: 6.9 10*3/uL (ref 4.0–10.5)

## 2014-02-23 LAB — MICROALBUMIN / CREATININE URINE RATIO
CREATININE, U: 130.1 mg/dL
MICROALB UR: 0.2 mg/dL (ref 0.0–1.9)
Microalb Creat Ratio: 0.2 mg/g (ref 0.0–30.0)

## 2014-02-23 LAB — TSH: TSH: 3.37 u[IU]/mL (ref 0.35–4.50)

## 2014-02-23 LAB — LIPID PANEL
Cholesterol: 172 mg/dL (ref 0–200)
HDL: 55.1 mg/dL (ref >39.00)
LDL CALC: 104 mg/dL — AB (ref 0–99)
NONHDL: 116.9
Total CHOL/HDL Ratio: 3
Triglycerides: 64 mg/dL (ref 0.0–149.0)
VLDL: 12.8 mg/dL (ref 0.0–40.0)

## 2014-02-23 LAB — HEMOGLOBIN A1C: HEMOGLOBIN A1C: 7.9 % — AB (ref 4.6–6.5)

## 2020-01-13 ENCOUNTER — Inpatient Hospital Stay: Admission: RE | Admit: 2020-01-13 | Payer: Self-pay | Source: Ambulatory Visit
